# Patient Record
Sex: Male | Born: 1980 | Race: White | Hispanic: Yes | Marital: Single | Smoking: Never smoker
Health system: Southern US, Community
[De-identification: ages and names within clinical notes are randomized; demographics above are authoritative.]

## PROBLEM LIST (undated history)

## (undated) DIAGNOSIS — Z8719 Personal history of other diseases of the digestive system: Secondary | ICD-10-CM

---

## 2009-07-26 ENCOUNTER — Emergency Department (HOSPITAL_COMMUNITY): Admission: EM | Admit: 2009-07-26 | Discharge: 2009-07-26 | Payer: Self-pay | Admitting: Emergency Medicine

## 2011-03-12 LAB — URINALYSIS, ROUTINE W REFLEX MICROSCOPIC
Glucose, UA: NEGATIVE mg/dL
Ketones, ur: 15 mg/dL — AB
Specific Gravity, Urine: 1.031 — ABNORMAL HIGH (ref 1.005–1.030)
pH: 6 (ref 5.0–8.0)

## 2011-03-12 LAB — HEPATIC FUNCTION PANEL
ALT: 21 U/L (ref 0–53)
AST: 24 U/L (ref 0–37)
Albumin: 3.9 g/dL (ref 3.5–5.2)
Alkaline Phosphatase: 73 U/L (ref 39–117)
Bilirubin, Direct: 0.2 mg/dL (ref 0.0–0.3)
Indirect Bilirubin: 1.3 mg/dL — ABNORMAL HIGH (ref 0.3–0.9)
Total Bilirubin: 1.5 mg/dL — ABNORMAL HIGH (ref 0.3–1.2)
Total Protein: 7 g/dL (ref 6.0–8.3)

## 2011-03-12 LAB — BASIC METABOLIC PANEL
BUN: 12 mg/dL (ref 6–23)
CO2: 27 mEq/L (ref 19–32)
Calcium: 8.9 mg/dL (ref 8.4–10.5)
Chloride: 104 mEq/L (ref 96–112)
Creatinine, Ser: 1.09 mg/dL (ref 0.4–1.5)
GFR calc Af Amer: >60 mL/min (ref >60)
GFR calc non Af Amer: >60 mL/min (ref >60)
Glucose, Bld: 115 mg/dL — ABNORMAL HIGH (ref 70–99)
Potassium: 4 mEq/L (ref 3.5–5.1)
Sodium: 136 mEq/L (ref 135–145)

## 2011-03-12 LAB — DIFFERENTIAL
Basophils Absolute: 0 10*3/uL (ref 0.0–0.1)
Basophils Relative: 0 % (ref 0–1)
Eosinophils Absolute: 0.1 10*3/uL (ref 0.0–0.7)
Eosinophils Relative: 1 % (ref 0–5)
Lymphocytes Relative: 25 % (ref 12–46)
Lymphs Abs: 1.3 10*3/uL (ref 0.7–4.0)
Monocytes Absolute: 0.2 10*3/uL (ref 0.1–1.0)
Monocytes Relative: 4 % (ref 3–12)
Neutro Abs: 3.6 10*3/uL (ref 1.7–7.7)
Neutrophils Relative %: 69 % (ref 43–77)

## 2011-03-12 LAB — CBC
RBC: 5.05 MIL/uL (ref 4.22–5.81)
WBC: 5.2 10*3/uL (ref 4.0–10.5)

## 2011-03-12 LAB — LIPASE, BLOOD: Lipase: 19 U/L (ref 11–59)

## 2013-01-18 ENCOUNTER — Emergency Department (HOSPITAL_COMMUNITY)
Admission: EM | Admit: 2013-01-18 | Discharge: 2013-01-18 | Disposition: A | Discharge status: Home / Self Care | Payer: Self-pay | Attending: Emergency Medicine | Admitting: Emergency Medicine

## 2013-01-18 ENCOUNTER — Emergency Department (HOSPITAL_COMMUNITY): Payer: Self-pay

## 2013-01-18 ENCOUNTER — Encounter (HOSPITAL_COMMUNITY): Payer: Self-pay | Admitting: Emergency Medicine

## 2013-01-18 DIAGNOSIS — R739 Hyperglycemia, unspecified: Secondary | ICD-10-CM

## 2013-01-18 DIAGNOSIS — R0789 Other chest pain: Secondary | ICD-10-CM

## 2013-01-18 DIAGNOSIS — R631 Polydipsia: Secondary | ICD-10-CM | POA: Insufficient documentation

## 2013-01-18 DIAGNOSIS — R040 Epistaxis: Secondary | ICD-10-CM | POA: Insufficient documentation

## 2013-01-18 DIAGNOSIS — R071 Chest pain on breathing: Secondary | ICD-10-CM | POA: Insufficient documentation

## 2013-01-18 DIAGNOSIS — R7309 Other abnormal glucose: Secondary | ICD-10-CM | POA: Insufficient documentation

## 2013-01-18 DIAGNOSIS — R350 Frequency of micturition: Secondary | ICD-10-CM | POA: Insufficient documentation

## 2013-01-18 LAB — BASIC METABOLIC PANEL
BUN: 9 mg/dL (ref 6–23)
CO2: 27 mEq/L (ref 19–32)
Chloride: 105 mEq/L (ref 96–112)
Creatinine, Ser: 0.9 mg/dL (ref 0.50–1.35)
Glucose, Bld: 170 mg/dL — ABNORMAL HIGH (ref 70–99)
Potassium: 4.1 mEq/L (ref 3.5–5.1)

## 2013-01-18 LAB — CBC WITH DIFFERENTIAL/PLATELET
Basophils Relative: 0 % (ref 0–1)
HCT: 43.8 % (ref 39.0–52.0)
Hemoglobin: 15.8 g/dL (ref 13.0–17.0)
Lymphocytes Relative: 30 % (ref 12–46)
Lymphs Abs: 1.7 10*3/uL (ref 0.7–4.0)
MCHC: 36.1 g/dL — ABNORMAL HIGH (ref 30.0–36.0)
Monocytes Absolute: 0.4 10*3/uL (ref 0.1–1.0)
Monocytes Relative: 8 % (ref 3–12)
Neutro Abs: 3.3 10*3/uL (ref 1.7–7.7)
RBC: 5.25 MIL/uL (ref 4.22–5.81)

## 2013-01-18 MED ORDER — IBUPROFEN 600 MG PO TABS: 600.0000 mg | ORAL_TABLET | Freq: Four times a day (QID) | ORAL | Status: DC | PRN

## 2013-01-18 NOTE — ED Notes (Signed)
Used language line to communicate with patient.  

## 2013-01-18 NOTE — ED Notes (Signed)
Onset 2-3 weeks ago pain left forearm and bilateral lower extremity pain.  Today woke up with heart palpitations pain 9/10 achy sharp currently 2/10 achy.  Also states intermittent bloody nose scant amount.

## 2013-01-18 NOTE — ED Provider Notes (Signed)
History     CSN: [id number]  Arrival date & time 01/18/13  1257   First MD Initiated Contact with Patient 01/18/13 1658      Chief Complaint  Patient presents with  . Chest Pain  . Epistaxis    Patient is a 32 y.o. male presenting with chest pain and nosebleeds.  Chest Pain Epistaxis   Onset 2-3 weeks ago pain left forearm and bilateral lower extremity pain. Today woke up with heart palpitations pain 9/10 achy sharp currently 2/10 achy. Also states intermittent bloody nose scant amount.  Patient also admits increased thirst and urination.  Patient apparently had upper rest for symptoms 1-2 days ago.  Denies any family history of coronary disease.  Denies drug abuse.  Pain can occur at rest or with activity and is unpredictable.   History reviewed. No pertinent past medical history.  History reviewed. No pertinent past surgical history.  No family history on file.  History  Substance Use Topics  . Smoking status: Never Smoker   . Smokeless tobacco: Not on file  . Alcohol Use: Yes      Review of Systems  HENT: Positive for nosebleeds.   Cardiovascular: Positive for chest pain.    Allergies  Review of patient's allergies indicates no known allergies.  Home Medications   Current Outpatient Rx  Name  Route  Sig  Dispense  Refill  . ibuprofen (ADVIL,MOTRIN) 600 MG tablet   Oral   Take 1 tablet (600 mg total) by mouth every 6 (six) hours as needed for pain.   30 tablet   0     BP 129/75  Pulse 62  Temp(Src) 98.6 F (37 C) (Oral)  Resp 21  SpO2 100%  Physical Exam  Nursing note and vitals reviewed. Constitutional: He is oriented to person, place, and time. He appears well-developed and well-nourished. No distress.  HENT:  Head: Normocephalic and atraumatic.  Eyes: Pupils are equal, round, and reactive to light.  Neck: Normal range of motion.  Cardiovascular: Normal rate and intact distal pulses.   Pulmonary/Chest: No respiratory distress. He has no  wheezes. He has no rales.  Abdominal: Normal appearance. He exhibits no distension. There is no tenderness.  Musculoskeletal: Normal range of motion.  Neurological: He is alert and oriented to person, place, and time. No cranial nerve deficit.  Skin: Skin is warm and dry. No rash noted.  Psychiatric: He has a normal mood and affect. His behavior is normal.    ED Course  Procedures (including critical care time)  Date: 01/18/2013  Rate: 90  Rhythm: normal sinus rhythm with sinus arrhythmia  QRS Axis: normal  Intervals: normal  ST/T Wave abnormalities: normal  Conduction Disutrbances: none Interpretation: Normal        Labs Reviewed  CBC WITH DIFFERENTIAL - Abnormal; Notable for the following:    MCHC 36.1 (*)    All other components within normal limits  BASIC METABOLIC PANEL - Abnormal; Notable for the following:    Glucose, Bld 170 (*)    All other components within normal limits  POCT I-STAT TROPONIN I   Dg Chest 2 View  01/18/2013  *RADIOLOGY REPORT*  Clinical Data: Chest pain, epistaxis, rapid heart rate  CHEST - 2 VIEW  Comparison: None.  Findings: No active infiltrate or effusion is seen.  Slightly prominent perihilar markings are noted.  Mediastinal contours appear normal.  The heart is within normal limits in size.  No skeletal abnormality is seen to  IMPRESSION: No active  lung disease.  Slightly prominent peribronchial thickening.   Original Report Authenticated By: Dwyane Dee, M.D.      1. Hyperglycemia   2. Atypical chest pain       MDM         Nelia Shi, MD 01/18/13 [phone]

## 2014-08-29 ENCOUNTER — Ambulatory Visit: Payer: Self-pay

## 2017-07-25 ENCOUNTER — Encounter (HOSPITAL_COMMUNITY): Payer: Self-pay | Admitting: Emergency Medicine

## 2017-07-25 ENCOUNTER — Ambulatory Visit (HOSPITAL_COMMUNITY)
Admission: EM | Admit: 2017-07-25 | Discharge: 2017-07-25 | Disposition: A | Discharge status: Home / Self Care | Payer: Self-pay | Attending: Family Medicine | Admitting: Family Medicine

## 2017-07-25 DIAGNOSIS — R1013 Epigastric pain: Secondary | ICD-10-CM

## 2017-07-25 DIAGNOSIS — K219 Gastro-esophageal reflux disease without esophagitis: Secondary | ICD-10-CM

## 2017-07-25 MED ORDER — PANTOPRAZOLE SODIUM 20 MG PO TBEC: 20.0000 mg | DELAYED_RELEASE_TABLET | Freq: Every day | ORAL | 0 refills | Status: DC

## 2017-07-25 NOTE — Discharge Instructions (Signed)
Start protonix with your zantac. Follow directions on the resource page for foods to avoid. Avoid laying down after eating. Continue to treat for constipation, as it could be contributing to your symptoms. Follow up with PCP or GI for further workup and treatment needed for symptoms.

## 2017-07-25 NOTE — ED Provider Notes (Signed)
[facility]    CSN: [id number] Arrival date & time: 07/25/17  1140     History   Chief Complaint Chief Complaint  Patient presents with  . Abdominal Pain    HPI Marco Blackwell is a 36 y.o. male.   36 year old male comes in for 10 year history of epigastric pain, nausea, vomiting, decrease food intake. He has been to the emergency department twice 4-5 years ago for same symptoms, but had described it as chest pain due to language barrier and had cardiac workup instead. He is in with family member who is the translator today. Epigastric pain can radiate to the left upper quadrant as well as the back. He has been taking Zantac with little relief. He states pain is now constant, with a burning sensation. It is worse with food intake with spicy food worse then bland food. He's had mild vomiting due to acid reflux, denies blood in vomit. He struggles with constipation, and has been taking vitamins as well as smoothies to help with constipation, had normal bowel movement today and yesterday. Denies blood in stool. Denies fever, chills, night sweats. He endorses chest pain and shortness of breath during the burning sensation, which has also prevented him from exercising. No personal history of hypertension, heart disease, diabetes. No family history of heart disease or diabetes, specifically denies family history of MIs. Denies history of abdominal surgeries.      History reviewed. No pertinent past medical history.  There are no active problems to display for this patient.   History reviewed. No pertinent surgical history.     Home Medications    Prior to Admission medications   Medication Sig Start Date End Date Taking? Authorizing Provider  aluminum-magnesium hydroxide 200-200 MG/5ML suspension Take by mouth every 6 (six) hours as needed for indigestion.   Yes [provider]  ranitidine (ZANTAC) 150 MG tablet Take 150 mg by mouth 2 (two) times daily.   Yes  [provider]  pantoprazole (PROTONIX) 20 MG tablet Take 1 tablet (20 mg total) by mouth daily. 07/25/17   Belinda Fisher, PA-C    Family History No family history on file.  Social History Social History  Substance Use Topics  . Smoking status: Never Smoker  . Smokeless tobacco: Not on file  . Alcohol use Yes     Allergies   Patient has no known allergies.   Review of Systems Review of Systems  Reason unable to perform ROS: See HPI as above.     Physical Exam Triage Vital Signs ED Triage Vitals  Enc Vitals Group     BP 07/25/17 1231 122/77     Pulse Rate 07/25/17 1231 (!) 51     Resp 07/25/17 1231 18     Temp 07/25/17 1231 98.2 F (36.8 C)     Temp Source 07/25/17 1231 Oral     SpO2 07/25/17 1231 99 %     Weight --      Height --      Head Circumference --      Peak Flow --      Pain Score 07/25/17 1227 10     Pain Loc --      Pain Edu? --      Excl. in GC? --    No data found.   Updated Vital Signs BP 122/77 (BP Location: Left Arm)   Pulse (!) 51   Temp 98.2 F (36.8 C) (Oral)   Resp 18  SpO2 99%    Physical Exam  Constitutional: He is oriented to person, place, and time. He appears well-developed and well-nourished. No distress.  HENT:  Head: Normocephalic and atraumatic.  Eyes: Pupils are equal, round, and reactive to light. Conjunctivae are normal.  Cardiovascular: Normal rate, regular rhythm and normal heart sounds.  Exam reveals no gallop and no friction rub.   No murmur heard. Pulmonary/Chest: Effort normal and breath sounds normal. No respiratory distress. He has no wheezes. He has no rales.  Abdominal: Soft. Bowel sounds are normal. He exhibits no mass. There is tenderness (epigastric). There is no rebound and no guarding. No hernia.  Neurological: He is alert and oriented to person, place, and time.  Skin: Skin is warm and dry.     UC Treatments / Results  Labs (all labs ordered are listed, but only abnormal results are  displayed) Labs Reviewed - No data to display  EKG  EKG Interpretation None       Radiology No results found.  Procedures ED EKG Date/Time: 07/25/2017 5:00 PM Performed by: Linward Headland V Authorized by: Linward Headland V   ECG reviewed by ED Physician in the absence of a cardiologist: yes   Previous ECG:    Previous ECG:  Compared to current   Comparison ECG info:  Bradycardia, otherwise no changes.  Interpretation:    Interpretation: normal   Rate:    ECG rate:  55   ECG rate assessment: bradycardic   Rhythm:    Rhythm: sinus rhythm      (including critical care time)  Medications Ordered in UC Medications - No data to display   Initial Impression / Assessment and Plan / UC Course  I have reviewed the triage vital signs and the nursing notes.  Pertinent labs & imaging results that were available during my care of the patient were reviewed by me and considered in my medical decision making (see chart for details).    Discussed with patient and family member no alarming signs today. EKG performed today with bradycardia at 55 bpm, normal sinus rhythm. Patient without history of diabetes, hypertension, heart disease. No family history of heart disease or diabetes. Low suspicion for cardiac causes of epigastric to left upper quadrant pain, chest pain and shortness of breath. Will start treatment for GERD, patient to continue zantac and start protonix. Instructions on food to avoid given to patient. Patient also to continue treatment of constipation, which could be contributing to his current symptoms. Patient to follow up with PCP and GI specialist for further evaluation of abdominal pain. Return precautions given.  Final Clinical Impressions(s) / UC Diagnoses   Final diagnoses:  Epigastric pain  Gastroesophageal reflux disease without esophagitis    New Prescriptions Discharge Medication List as of 07/25/2017  1:43 PM    START taking these medications   Details  pantoprazole  (PROTONIX) 20 MG tablet Take 1 tablet (20 mg total) by mouth daily., Starting Tue 07/25/2017, Normal          Belinda Fisher, PA-C 07/25/17 1716

## 2017-07-25 NOTE — ED Triage Notes (Signed)
Burning pain in center, epigastric area.  Sometimes pain moves slightly to left, and sometimes in the back.  Pain has been occurring for 10 years.  ?hiatal hernia, with eating feels reflux to a point of vomiting

## 2019-02-16 DIAGNOSIS — K649 Unspecified hemorrhoids: Secondary | ICD-10-CM | POA: Insufficient documentation

## 2019-02-16 DIAGNOSIS — R3589 Other polyuria: Secondary | ICD-10-CM | POA: Insufficient documentation

## 2019-02-16 DIAGNOSIS — K219 Gastro-esophageal reflux disease without esophagitis: Secondary | ICD-10-CM | POA: Insufficient documentation

## 2019-06-03 DIAGNOSIS — R739 Hyperglycemia, unspecified: Secondary | ICD-10-CM | POA: Insufficient documentation

## 2019-06-03 DIAGNOSIS — R030 Elevated blood-pressure reading, without diagnosis of hypertension: Secondary | ICD-10-CM | POA: Insufficient documentation

## 2019-06-03 DIAGNOSIS — M940 Chondrocostal junction syndrome [Tietze]: Secondary | ICD-10-CM | POA: Insufficient documentation

## 2019-06-03 DIAGNOSIS — R0789 Other chest pain: Secondary | ICD-10-CM | POA: Insufficient documentation

## 2020-02-04 ENCOUNTER — Ambulatory Visit: Payer: Self-pay | Admitting: Emergency Medicine

## 2020-02-05 ENCOUNTER — Encounter: Payer: Self-pay | Admitting: Emergency Medicine

## 2021-05-14 ENCOUNTER — Encounter: Payer: Self-pay | Admitting: Emergency Medicine

## 2021-05-14 ENCOUNTER — Emergency Department (INDEPENDENT_AMBULATORY_CARE_PROVIDER_SITE_OTHER)
Admission: EM | Admit: 2021-05-14 | Discharge: 2021-05-14 | Disposition: A | Discharge status: Home / Self Care | Payer: 59 | Source: Home / Self Care | Attending: Family Medicine | Admitting: Family Medicine

## 2021-05-14 ENCOUNTER — Other Ambulatory Visit: Payer: Self-pay

## 2021-05-14 DIAGNOSIS — K219 Gastro-esophageal reflux disease without esophagitis: Secondary | ICD-10-CM

## 2021-05-14 DIAGNOSIS — R1030 Lower abdominal pain, unspecified: Secondary | ICD-10-CM

## 2021-05-14 HISTORY — DX: Personal history of other diseases of the digestive system: Z87.19

## 2021-05-14 LAB — POCT URINALYSIS DIP (MANUAL ENTRY)
Bilirubin, UA: NEGATIVE
Blood, UA: NEGATIVE
Glucose, UA: NEGATIVE mg/dL
Ketones, POC UA: NEGATIVE mg/dL
Leukocytes, UA: NEGATIVE
Nitrite, UA: NEGATIVE
Protein Ur, POC: NEGATIVE mg/dL
Spec Grav, UA: >=1.03 — AB (ref 1.010–1.025)
Urobilinogen, UA: 0.2 E.U./dL
pH, UA: 6 (ref 5.0–8.0)

## 2021-05-14 MED ORDER — OMEPRAZOLE 40 MG PO CPDR: DELAYED_RELEASE_CAPSULE | ORAL | 1 refills | Status: DC

## 2021-05-14 NOTE — ED Triage Notes (Signed)
Abdominal pain - RLQ x 1 week with umbilical tenderness w/ urinary frequency & diarrhea (3-4 days) No OTC meds  Stool is black the last 3 days  Denies N/V Abd pain is 5/10 Constant sharp pain

## 2021-05-14 NOTE — ED Provider Notes (Signed)
[facility]    CSN: [id number] Arrival date & time: 05/14/21  1703      History   Chief Complaint Chief Complaint  Patient presents with   Abdominal Pain    RLQ    HPI Marco Blackwell is a 40 y.o. male.   Patient is Spanish speaking; interview facilitated by an interpretor. He has a past history of GERD, having taking a PPI intermittently.  During the past several weeks his epigastric discomfort has increased.  He often feels that eating might improve his abdominal discomfort, but he actually feels worse after eating.  During the past 3 days he has noted loose and occasional black stools.  He denies nausea/vomiting.  He has also developed urinary frequency without dysuria. During the past two weeks he has also developed lower abdominal pain, more prominent in the lower right abdomen.  The pain radiates somewhat to his right flank, and is worse with any movement such as bending over.  He denies fevers, chills, and sweats.  The history is provided by the patient. The history is limited by a language barrier. A language interpreter was used.  Abdominal Pain Pain location:  Periumbilical and RLQ Pain quality: aching and dull   Pain radiates to:  R flank Pain severity:  Moderate Duration:  2 weeks Timing:  Constant Progression:  Worsening Chronicity:  New Context: awakening from sleep and eating   Context: not diet changes, not laxative use, not medication withdrawal, not previous surgeries, not recent illness, not retching and not suspicious food intake   Relieved by:  Nothing Worsened by:  Movement and palpation Ineffective treatments:  None tried Associated symptoms: anorexia, fatigue, melena and nausea   Associated symptoms: no chest pain, no chills, no constipation, no cough, no diarrhea, no dysuria, no fever, no hematemesis, no hematochezia, no hematuria, no shortness of breath, no sore throat and no vomiting   Risk factors: no NSAID use    Past  Medical History:  Diagnosis Date   H/O gastroesophageal reflux (GERD)     Patient Active Problem List   Diagnosis Date Noted   Atypical chest pain 06/03/2019   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 No pertinent surgical history.     Home Medications    Prior to Admission medications   Medication Sig Start Date End Date Taking? Authorizing Provider  omeprazole (PRILOSEC) 40 MG capsule Take one cap PO once daily, 20 minutes AC 05/14/21  Yes Lattie Haw, MD  aluminum-magnesium hydroxide 200-200 MG/5ML suspension Take by mouth every 6 (six) hours as needed for indigestion. Patient not taking: Reported on 05/14/2021    [provider]  ranitidine (ZANTAC) 150 MG tablet Take 150 mg by mouth 2 (two) times daily. Patient not taking: Reported on 05/14/2021    [provider]    Family History Family History  Problem Relation Age of Onset   Hypertension Mother    Thyroid disease Mother    Healthy Father     Social History Social History   Tobacco Use   Smoking status: Never   Smokeless tobacco: Never  Substance Use Topics   Alcohol use: Yes    Alcohol/week: 2.0 standard drinks    Types: 2 Standard drinks or equivalent per week   Drug use: No  Allergies   Patient has no known allergies.   Review of Systems Review of Systems  Constitutional:  Positive for activity change and fatigue. Negative for appetite change, chills, diaphoresis and fever.  HENT: Negative.  Negative for sore throat.   Eyes: Negative.   Respiratory:  Negative for cough and shortness of breath.   Cardiovascular:  Negative for chest pain.  Gastrointestinal:  Positive for abdominal pain, anorexia, melena and nausea.  Negative for abdominal distention, blood in stool, constipation, diarrhea, hematemesis, hematochezia, rectal pain and vomiting.  Genitourinary:  Negative for difficulty urinating, dysuria, flank pain, frequency, hematuria, penile pain, penile swelling, scrotal swelling, testicular pain and urgency.  Musculoskeletal:  Negative for neck pain.  Skin: Negative.   Hematological:  Negative for adenopathy.    Physical Exam Triage Vital Signs ED Triage Vitals  Enc Vitals Group     BP 05/14/21 1738 132/76     Pulse Rate 05/14/21 1738 63     Resp 05/14/21 1738 17     Temp 05/14/21 1738 98.9 F (37.2 C)     Temp Source 05/14/21 1738 Oral     SpO2 05/14/21 1738 98 %     Weight 05/14/21 1745 185 lb (83.9 kg)     Height 05/14/21 1745 5\' 6"  (1.676 m)     Head Circumference --      Peak Flow --      Pain Score 05/14/21 1744 5     Pain Loc --      Pain Edu? --      Excl. in GC? --    No data found.  Updated Vital Signs BP 132/76 (BP Location: Right Arm)   Pulse 63   Temp 98.9 F (37.2 C) (Oral)   Resp 17   Ht 5\' 6"  (1.676 m)   Wt 83.9 kg   SpO2 98%   BMI 29.86 kg/m   Visual Acuity Right Eye Distance:   Left Eye Distance:   Bilateral Distance:    Right Eye Near:   Left Eye Near:    Bilateral Near:     Physical Exam Vitals and nursing note reviewed.  Constitutional:      General: He is not in acute distress.    Appearance: He is not ill-appearing.  HENT:     Head: Normocephalic.     Mouth/Throat:     Mouth: Mucous membranes are moist.  Eyes:     Extraocular Movements: Extraocular movements intact.  Cardiovascular:     Rate and Rhythm: Normal rate and regular rhythm.     Heart sounds: Normal heart sounds.  Pulmonary:     Breath sounds: Normal breath sounds.  Abdominal:     General: Abdomen is flat. Bowel sounds are normal. There is no distension.     Palpations: Abdomen is soft. There is no hepatomegaly, splenomegaly or mass.     Tenderness: There is abdominal  tenderness in the right lower quadrant, epigastric area and periumbilical area. There is guarding. There is no right CVA tenderness, left CVA tenderness or rebound. Negative signs include McBurney's sign, psoas sign and obturator sign.     Hernia: No hernia is present.    Skin:    General: Skin is warm and dry.     Findings: No rash.  Neurological:     Mental Status: He is alert and oriented to person, place, and time.     UC Treatments / Results  Labs (all labs ordered are listed, but only abnormal results are displayed)  Labs Reviewed  POCT URINALYSIS DIP (MANUAL ENTRY) - Abnormal; Notable for the following components:      Result Value   Spec Grav, UA >=1.030 (*)    All other components within normal limits  COMPLETE METABOLIC PANEL WITH GFR  CBC WITH DIFFERENTIAL/PLATELET    EKG   Radiology No results found.  Procedures Procedures (including critical care time)  Medications Ordered in UC Medications - No data to display  Initial Impression / Assessment and Plan / UC Course  I have reviewed the triage vital signs and the nursing notes.  Pertinent labs & imaging results that were available during my care of the patient were reviewed by me and considered in my medical decision making (see chart for details).    Patient appears to have recurrence of GERD, and possible duodenal ulcer. Resume PPI. Urinalysis unremarkable.  CBC and CMP pending. Suspect new onset ?appendicitis or ?diverticulitis. Return tomorrow morning for CT abdomen/pelvis with oral contrast.  Final Clinical Impressions(s) / UC Diagnoses   Final diagnoses:  Gastroesophageal reflux disease, unspecified whether esophagitis present  Lower abdominal pain     Discharge Instructions      If symptoms become significantly worse during the night or over the weekend, proceed to the local emergency room.      ED Prescriptions     Medication Sig Dispense Auth. Provider   omeprazole (PRILOSEC) 40 MG  capsule Take one cap PO once daily, 20 minutes AC 30 capsule Lattie Haw, MD         Lattie Haw, MD 05/15/21 [phone]

## 2021-05-14 NOTE — Discharge Instructions (Signed)
If symptoms become significantly worse during the night or over the weekend, proceed to the local emergency room.  

## 2021-05-15 ENCOUNTER — Emergency Department (INDEPENDENT_AMBULATORY_CARE_PROVIDER_SITE_OTHER): Payer: 59

## 2021-05-15 ENCOUNTER — Emergency Department
Admission: EM | Admit: 2021-05-15 | Discharge: 2021-05-15 | Disposition: A | Discharge status: Home / Self Care | Payer: 59 | Source: Home / Self Care

## 2021-05-15 DIAGNOSIS — K921 Melena: Secondary | ICD-10-CM

## 2021-05-15 DIAGNOSIS — R1031 Right lower quadrant pain: Secondary | ICD-10-CM

## 2021-05-15 LAB — COMPLETE METABOLIC PANEL WITH GFR
AG Ratio: 1.5 (calc) (ref 1.0–2.5)
ALT: 66 U/L — ABNORMAL HIGH (ref 9–46)
AST: 37 U/L (ref 10–40)
Albumin: 4.9 g/dL (ref 3.6–5.1)
Alkaline phosphatase (APISO): 79 U/L (ref 36–130)
BUN: 15 mg/dL (ref 7–25)
CO2: 24 mmol/L (ref 20–32)
Calcium: 9.9 mg/dL (ref 8.6–10.3)
Chloride: 105 mmol/L (ref 98–110)
Creat: 1.06 mg/dL (ref 0.60–1.35)
GFR, Est African American: 102 mL/min/{1.73_m2} (ref >=60)
GFR, Est Non African American: 88 mL/min/{1.73_m2} (ref >=60)
Globulin: 3.3 g/dL (calc) (ref 1.9–3.7)
Glucose, Bld: 95 mg/dL (ref 65–99)
Potassium: 3.9 mmol/L (ref 3.5–5.3)
Sodium: 138 mmol/L (ref 135–146)
Total Bilirubin: 0.9 mg/dL (ref 0.2–1.2)
Total Protein: 8.2 g/dL — ABNORMAL HIGH (ref 6.1–8.1)

## 2021-05-15 LAB — CBC WITH DIFFERENTIAL/PLATELET
Absolute Monocytes: 372 cells/uL (ref 200–950)
Basophils Absolute: 43 cells/uL (ref 0–200)
Basophils Relative: 0.7 %
Eosinophils Absolute: 79 cells/uL (ref 15–500)
Eosinophils Relative: 1.3 %
HCT: 47.8 % (ref 38.5–50.0)
Hemoglobin: 16.4 g/dL (ref 13.2–17.1)
Lymphs Abs: 1970 cells/uL (ref 850–3900)
MCH: 30.2 pg (ref 27.0–33.0)
MCHC: 34.3 g/dL (ref 32.0–36.0)
MCV: 88 fL (ref 80.0–100.0)
MPV: 9.4 fL (ref 7.5–12.5)
Monocytes Relative: 6.1 %
Neutro Abs: 3636 cells/uL (ref 1500–7800)
Neutrophils Relative %: 59.6 %
Platelets: 304 10*3/uL (ref 140–400)
RBC: 5.43 10*6/uL (ref 4.20–5.80)
RDW: 13 % (ref 11.0–15.0)
Total Lymphocyte: 32.3 %
WBC: 6.1 10*3/uL (ref 3.8–10.8)

## 2021-05-15 NOTE — ED Triage Notes (Signed)
Pt returns today for abd CT w/ PO contrast - RBTO verified from Dr Cathren Harsh. Pt to CT w/ interpretor [employer] ipad. No VS or extended triage done

## 2021-05-16 ENCOUNTER — Telehealth: Payer: Self-pay | Admitting: Emergency Medicine

## 2021-05-16 NOTE — Telephone Encounter (Signed)
Pt returns today to review results in person - copy of results given to pt per Dr Cathren Harsh- results explained in person w/ [facility] interpretor -[place] [phone]. Pt verbalized an understanding that he should continue reflux medicine. If pain is not tolerable w/ OTC tylenol, heating pad or ice, then patient should follow up in ED. Pt verbalized an understanding, no other questions at this time

## 2021-10-06 ENCOUNTER — Emergency Department (INDEPENDENT_AMBULATORY_CARE_PROVIDER_SITE_OTHER)
Admission: EM | Admit: 2021-10-06 | Discharge: 2021-10-06 | Disposition: A | Discharge status: Home / Self Care | Payer: 59 | Source: Home / Self Care | Attending: Family Medicine | Admitting: Family Medicine

## 2021-10-06 ENCOUNTER — Other Ambulatory Visit: Payer: Self-pay

## 2021-10-06 DIAGNOSIS — M542 Cervicalgia: Secondary | ICD-10-CM | POA: Diagnosis not present

## 2021-10-06 MED ORDER — MELOXICAM 15 MG PO TABS: 15.0000 mg | ORAL_TABLET | Freq: Every day | ORAL | 0 refills | Status: DC

## 2021-10-06 MED ORDER — CYCLOBENZAPRINE HCL 10 MG PO TABS: 10.0000 mg | ORAL_TABLET | Freq: Every day | ORAL | 0 refills | Status: DC

## 2021-10-06 NOTE — Discharge Instructions (Addendum)
Take the meloxicam once a day.  This is an anti-inflammatory medicine.  Take it with a meal Take the cyclobenzaprine at bedtime.  This is a muscle relaxer.  It will help relax the muscles in her neck. The thyroid test will be available in a day or 2.  It will be available on MyChart You need to follow-up with a primary care doctor for your regular medical needs and preventative medical care

## 2021-10-06 NOTE — ED Triage Notes (Signed)
Pt c/o neck pain x 2 weeks. Denies injury. Pain 8/10 No OTC meds tried.

## 2021-10-06 NOTE — ED Provider Notes (Signed)
[facility]    CSN: [id number] Arrival date & time: 10/06/21  1628      History   Chief Complaint Chief Complaint  Patient presents with   Neck Pain    HPI Marco Blackwell is a 40 y.o. male.   HPI  40 year old Hispanic gentleman who has no primary care doctor.  Is here with complaint of neck pain for 2 weeks.  He states the neck pain is actually bother him off and on for a long time.  I am unable to determine exactly how long.  The patient states that he has pain in his anterior neck and points to his sternal notch point.  He points to the right side of his thyroid.  He states sometimes it feels like he cannot breathe or cannot swallow.  He then says the pain goes around to the back of his neck on the right side and then travels up behind his ear.  It is sometimes worse with movement and activity.  It hurts to turn his head towards the right side.  No numbness or weakness into the arms.  No trauma.  No overuse.  He states it came on gradually 1 day, uncertain what he was doing on that day.  He states thyroid disease runs in his family and mother.  No palpitations, no weight loss, no diarrhea.  He does still sometimes have GERD symptoms and points to his epigastrium.  He is seen with a Spanish interpreter.  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 No pertinent surgical history.     Home Medications    Prior to Admission medications   Medication Sig Start Date End Date Taking? Authorizing Provider  cyclobenzaprine (FLEXERIL) 10 MG  tablet Take 1 tablet (10 mg total) by mouth at bedtime. 10/06/21  Yes Eustace Moore, MD  meloxicam (MOBIC) 15 MG tablet Take 1 tablet (15 mg total) by mouth daily. 10/06/21  Yes Eustace Moore, MD  omeprazole (PRILOSEC) 40 MG capsule Take one cap PO once daily, 20 minutes [facility] 05/14/21   Lattie Haw, MD    Family History Family History  Problem Relation Age of Onset   Hypertension Mother    Thyroid disease Mother    Healthy Father     Social History Social History   Tobacco Use   Smoking status: Never   Smokeless tobacco: Never  Substance Use Topics   Alcohol use: Yes    Alcohol/week: 2.0 standard drinks    Types: 2 Standard drinks or equivalent per week   Drug use: No     Allergies   Patient has no known allergies.   Review of Systems Review of Systems  See HPI Physical Exam Triage Vital Signs ED Triage Vitals  Enc Vitals Group     BP 10/06/21 1707 (!) 153/69     Pulse Rate 10/06/21 1707 76     Resp 10/06/21 1707 17     Temp 10/06/21 1707 98.5 F (36.9 C)     Temp Source 10/06/21 1707 Oral     SpO2 10/06/21 1707 98 %  Weight --      Height --      Head Circumference --      Peak Flow --      Pain Score 10/06/21 1709 8     Pain Loc --      Pain Edu? --      Excl. in GC? --    No data found.  Updated Vital Signs BP (!) 153/69 (BP Location: Right Arm)   Pulse 76   Temp 98.5 F (36.9 C) (Oral)   Resp 17   SpO2 98%      Physical Exam Constitutional:      General: He is not in acute distress.    Appearance: He is well-developed and normal weight.  HENT:     Head: Normocephalic and atraumatic.     Right Ear: Tympanic membrane and ear canal normal.     Left Ear: Tympanic membrane and ear canal normal.     Nose: Nose normal.     Mouth/Throat:     Pharynx: No posterior oropharyngeal erythema.  Eyes:     Conjunctiva/sclera: Conjunctivae normal.     Pupils: Pupils are equal, round, and reactive to light.  Neck:     Vascular: No carotid  bruit.      Comments: Patient states is tender in the area described.  Sternal notch at the left side of the thyroid and up the left side of the anterior neck.  Pressure in this area causes pain to shoot up behind his right ear to the scalp.  This entire area has normal appearance, symmetric.  No swelling.  No erythema.  No warmth.  Patient has pain with range of motion turning face towards right Cardiovascular:     Rate and Rhythm: Normal rate.  Pulmonary:     Effort: Pulmonary effort is normal. No respiratory distress.  Abdominal:     General: There is no distension.     Palpations: Abdomen is soft.  Musculoskeletal:        General: Normal range of motion.     Cervical back: Tenderness present. No rigidity.  Lymphadenopathy:     Cervical: No cervical adenopathy.  Skin:    General: Skin is warm and dry.  Neurological:     Mental Status: He is alert.     UC Treatments / Results  Labs (all labs ordered are listed, but only abnormal results are displayed) Labs Reviewed - No data to display  EKG   Radiology No results found.  Procedures Procedures (including critical care time)  Medications Ordered in UC Medications - No data to display  Initial Impression / Assessment and Plan / UC Course  I have reviewed the triage vital signs and the nursing notes.  Pertinent labs & imaging results that were available during my care of the patient were reviewed by me and considered in my medical decision making (see chart for details).     Pain in neck unclear etiology.  We will check a TSH.  He thinks it is from stress and that he has muscle tension.  We will treat him with anti-inflammatories and muscle relaxers.  PCP is highly recommended Final Clinical Impressions(s) / UC Diagnoses   Final diagnoses:  Neck pain     Discharge Instructions      Take the meloxicam once a day.  This is an anti-inflammatory medicine.  Take it with a meal Take the cyclobenzaprine at bedtime.   This is a muscle relaxer.  It will help relax  the muscles in her neck. The thyroid test will be available in a day or 2.  It will be available on MyChart You need to follow-up with a primary care doctor for your regular medical needs and preventative medical care    ED Prescriptions     Medication Sig Dispense Auth. Provider   meloxicam (MOBIC) 15 MG tablet Take 1 tablet (15 mg total) by mouth daily. 15 tablet Eustace Moore, MD   cyclobenzaprine (FLEXERIL) 10 MG tablet Take 1 tablet (10 mg total) by mouth at bedtime. 20 tablet Eustace Moore, MD      PDMP not reviewed this encounter.   Eustace Moore, MD 10/06/21 [phone]

## 2021-10-07 LAB — TSH: TSH: 1.81 mIU/L (ref 0.40–4.50)

## 2021-10-08 NOTE — Progress Notes (Signed)
Patient came by office.  I discussed results with him. He verbalized understainding

## 2022-09-02 IMAGING — CT CT ABD-PELV W/O CM
2 of 4 series · 16 of 46 positions shown, 18 images · non-contrast
Comparison: None.

CLINICAL DATA: Right lower quadrant pain with blood in stools.

EXAM:
CT ABDOMEN AND PELVIS WITHOUT CONTRAST
TECHNIQUE: Multidetector CT imaging of the abdomen and pelvis was performed
following the standard protocol without IV contrast.

[Series 2: axial st · axial · 0.83mm/px · z∈[-532,-56]mm · 13 of 105 slices shown, 15 images]
[im 5/105  soft-tissue]
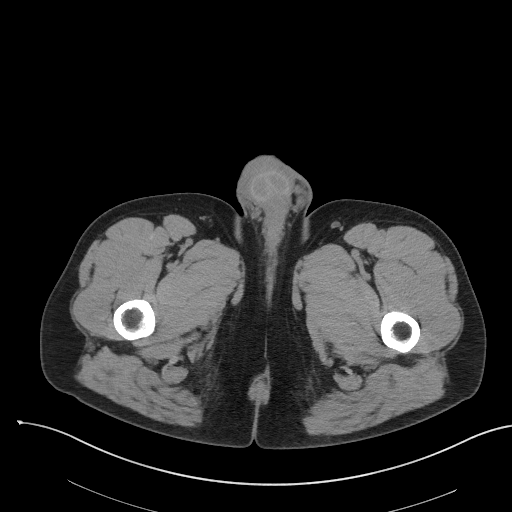
[im 5/105  bone]
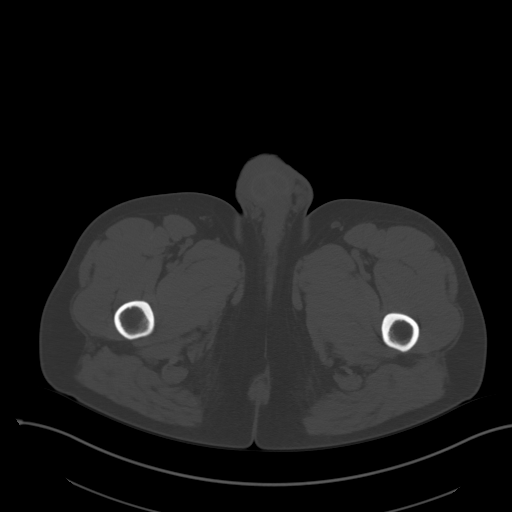
[im 13/105  soft-tissue]
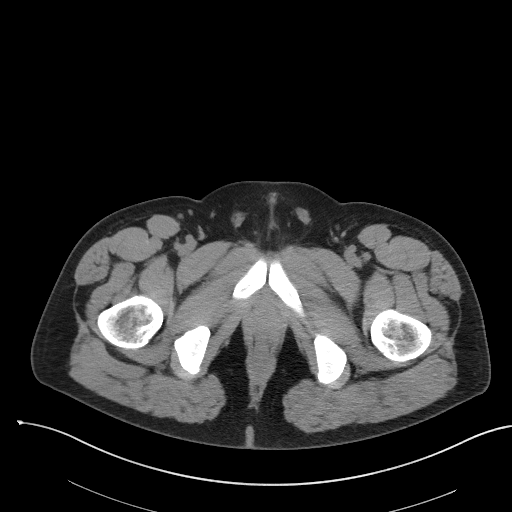
[im 21/105  soft-tissue]
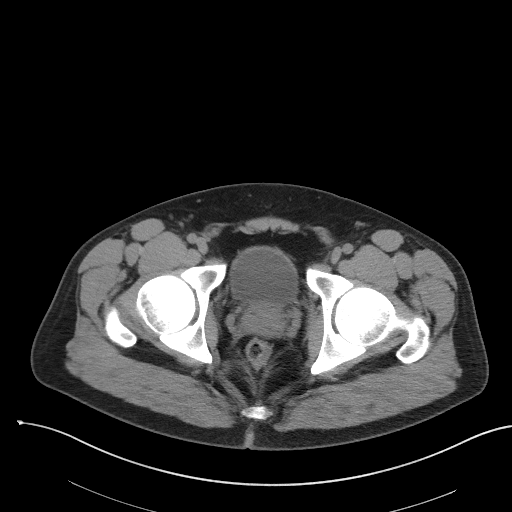
[im 30/105  soft-tissue]
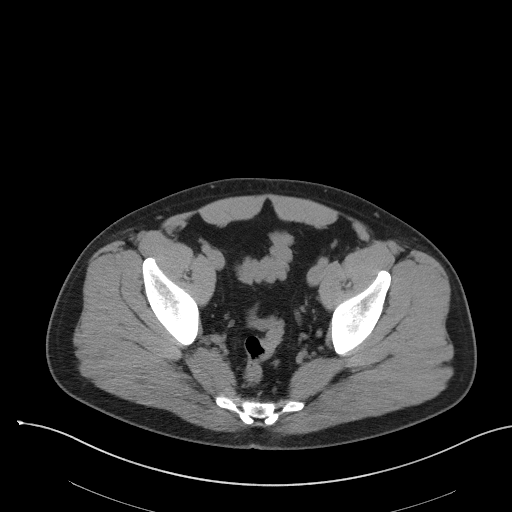
[im 38/105  soft-tissue]
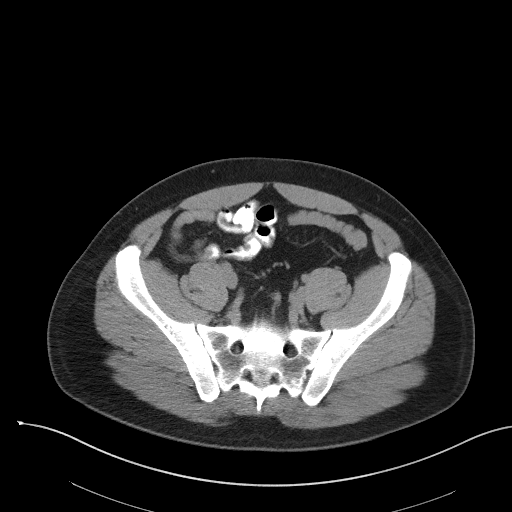
[im 46/105  soft-tissue]
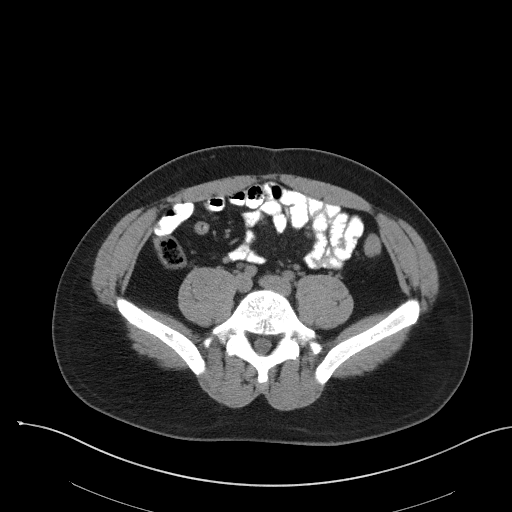
[im 55/105  soft-tissue]
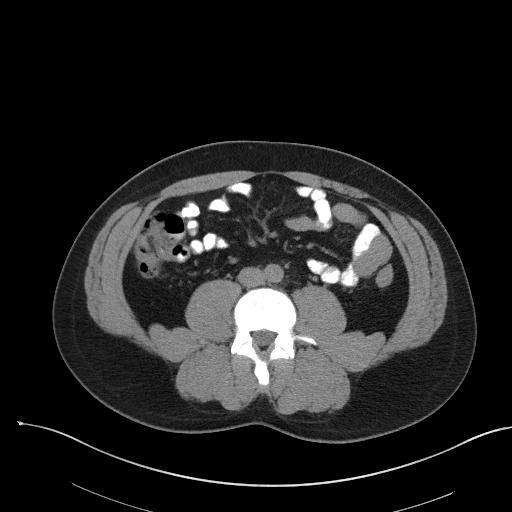
[im 59/105  soft-tissue]
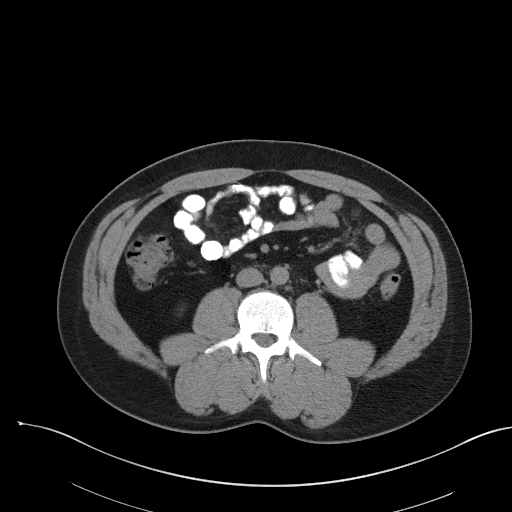
[im 67/105  soft-tissue]
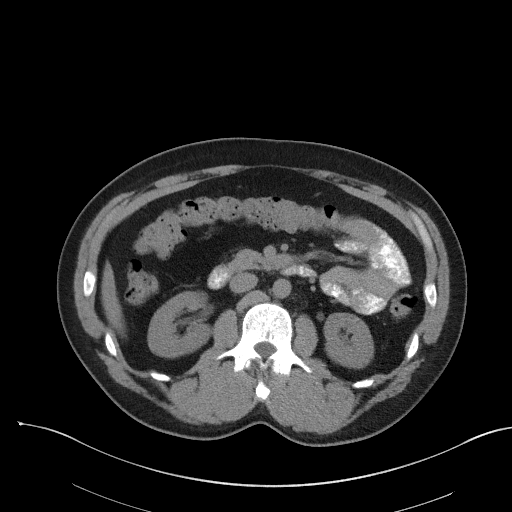
[im 67/105  bone]
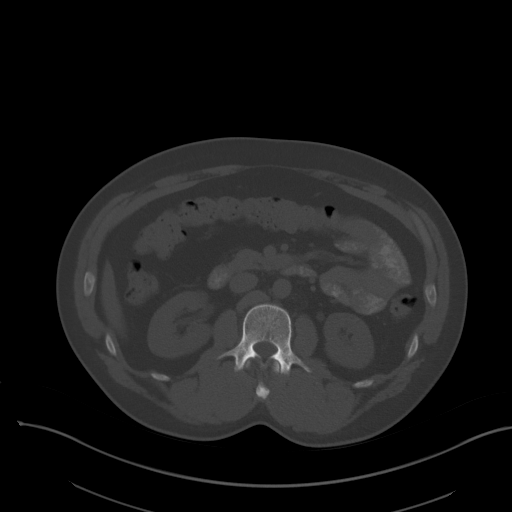
[im 75/105  soft-tissue]
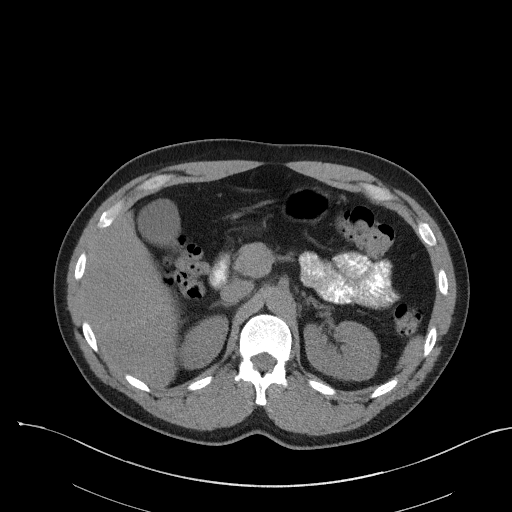
[im 84/105  soft-tissue]
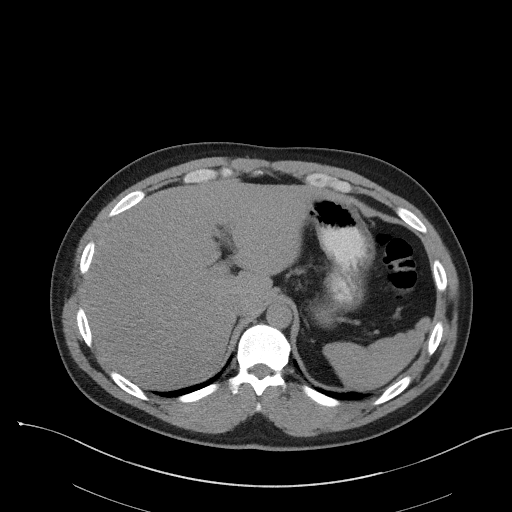
[im 92/105  soft-tissue]
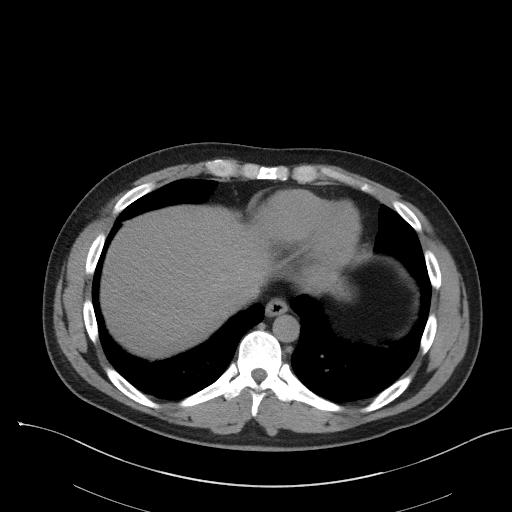
[im 100/105  soft-tissue]
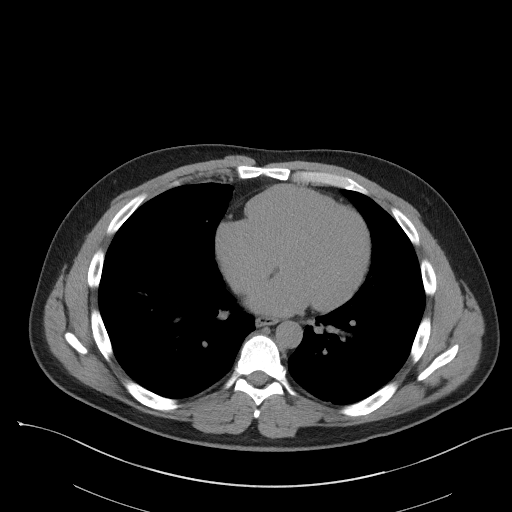

[Series 5: coronal st · coronal · 0.90mm/px · 3 of 101 slices shown]
[im 34/101  soft-tissue]
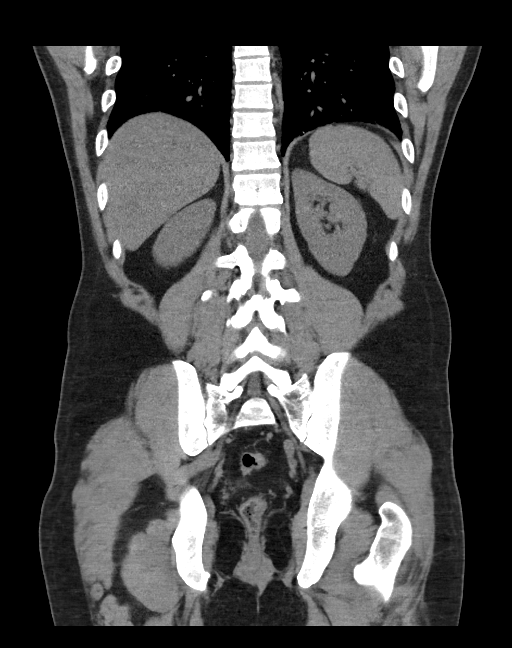
[im 45/101  soft-tissue]
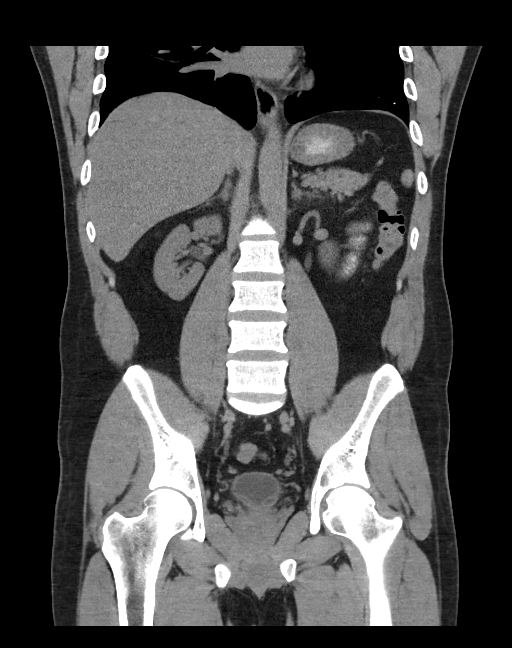
[im 56/101  soft-tissue]
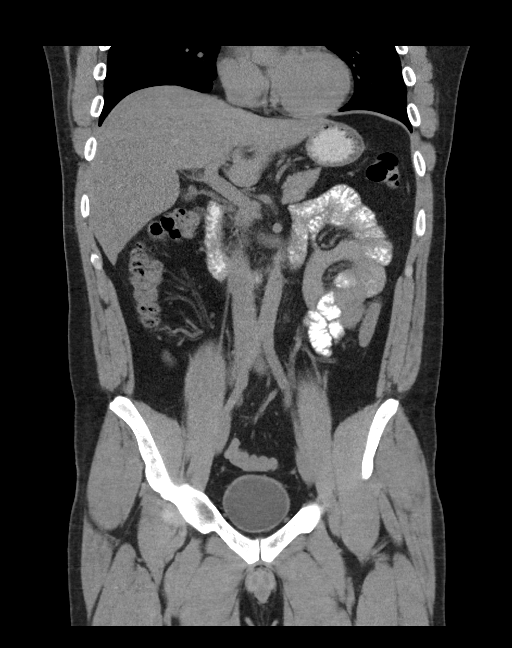

[16 of 46 positions shown; findings below may reference images not displayed]

FINDINGS: Lower chest: Lung bases demonstrate no acute airspace process.
Calcified granuloma over the right middle lobe.

Hepatobiliary: Liver, gallbladder and biliary tree are normal.

Pancreas: Normal.

Spleen: Normal.

Adrenals/Urinary Tract: Adrenal glands are normal. Kidneys are
normal in size without hydronephrosis or nephrolithiasis. Ureters
and bladder are normal.

Stomach/Bowel: Stomach and small bowel are normal. Appendix is
normal. Colon is normal.

Vascular/Lymphatic: Abdominal aorta is normal in caliber. There is
no evidence of adenopathy.

Reproductive: Normal.

Other: No free fluid or focal inflammatory change.

Musculoskeletal: No focal abnormality.
IMPRESSION: No acute findings in the abdomen/pelvis.

## 2023-07-13 ENCOUNTER — Ambulatory Visit
Admission: EM | Admit: 2023-07-13 | Discharge: 2023-07-13 | Disposition: A | Discharge status: Home / Self Care | Payer: 59 | Attending: Family Medicine | Admitting: Family Medicine

## 2023-07-13 ENCOUNTER — Encounter: Payer: Self-pay | Admitting: Emergency Medicine

## 2023-07-13 ENCOUNTER — Ambulatory Visit (INDEPENDENT_AMBULATORY_CARE_PROVIDER_SITE_OTHER): Payer: 59

## 2023-07-13 DIAGNOSIS — K219 Gastro-esophageal reflux disease without esophagitis: Secondary | ICD-10-CM

## 2023-07-13 DIAGNOSIS — K5900 Constipation, unspecified: Secondary | ICD-10-CM | POA: Diagnosis not present

## 2023-07-13 DIAGNOSIS — R1031 Right lower quadrant pain: Secondary | ICD-10-CM

## 2023-07-13 MED ORDER — POLYETHYLENE GLYCOL 3350 17 GM/SCOOP PO POWD: 1.0000 | Freq: Once | ORAL | 0 refills | Status: AC

## 2023-07-13 MED ORDER — PANTOPRAZOLE SODIUM 20 MG PO TBEC: 20.0000 mg | DELAYED_RELEASE_TABLET | Freq: Every day | ORAL | 0 refills | Status: AC

## 2023-07-13 NOTE — ED Triage Notes (Signed)
Patient c/o RLQ pain x 3 days.  Denies any nausea or vomiting.  Patient did have diarrhea prior to the pain but not since.  Denies any OTC meds.  Patient does drink a lot of water which has caused urinary frequency.

## 2023-07-13 NOTE — ED Provider Notes (Signed)
[facility]    CSN: [id number] Arrival date & time: 07/13/23  1006      History   Chief Complaint Chief Complaint  Patient presents with   Abdominal Pain    HPI Marco Blackwell is a 42 y.o. male.   HPI  Pleasant 42 year old Hispanic gentleman.  Seen with Spanish interpreter Viviana Patient has right lower quadrant pain for 3 days.  He states he had some loose bowels at the onset, but has not had a bowel movement for 3 days.  Feels like he needs to go to the bathroom but cannot.  No nausea or vomiting.  No change in appetite.  No fever or chills.  He has longstanding problems with GERD.  He states that the medicine so far have helped him.  He does not have a PCP.  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 No pertinent surgical history.     Home Medications    Prior to Admission medications   Medication Sig Start Date End Date Taking? Authorizing Provider  pantoprazole (PROTONIX) 20 MG tablet Take 1 tablet (20 mg total) by mouth daily. 07/13/23  Yes Eustace Moore, MD  polyethylene glycol powder [facility]) 17 GM/SCOOP powder Take 255 g by mouth once for 1 dose. 07/13/23 07/13/23 Yes Eustace Moore, MD    Family History Family History  Problem Relation Age of Onset   Hypertension Mother    Thyroid disease Mother    Healthy Father     Social History Social History   Tobacco Use   Smoking status: Never   Smokeless tobacco: Never  Vaping Use   Vaping status:  Never Used  Substance Use Topics   Alcohol use: Yes    Alcohol/week: 5.0 standard drinks of alcohol    Types: 3 Cans of beer, 2 Standard drinks or equivalent per week   Drug use: No     Allergies   Patient has no known allergies.   Review of Systems Review of Systems See HPI  Physical Exam Triage Vital Signs ED Triage Vitals  Encounter Vitals Group     BP 07/13/23 1020 134/89     Systolic BP Percentile --      Diastolic BP Percentile --      Pulse Rate 07/13/23 1020 68     Resp 07/13/23 1020 18     Temp 07/13/23 1020 98.3 F (36.8 C)     Temp Source 07/13/23 1020 Oral     SpO2 07/13/23 1020 100 %     Weight 07/13/23 1022 190 lb (86.2 kg)     Height 07/13/23 1022 5\' 7"  (1.702 m)     Head Circumference --      Peak Flow --      Pain Score 07/13/23 1021 7     Pain Loc --      Pain Education --      Exclude from Growth Chart --    No data found.  Updated Vital Signs BP 134/89 (BP Location: Right Arm)   Pulse  68   Temp 98.3 F (36.8 C) (Oral)   Resp 18   Ht 5\' 7"  (1.702 m)   Wt 86.2 kg   SpO2 100%   BMI 29.76 kg/m       Physical Exam Constitutional:      General: He is not in acute distress.    Appearance: He is well-developed.  HENT:     Head: Normocephalic and atraumatic.     Mouth/Throat:     Mouth: Mucous membranes are moist.  Eyes:     Conjunctiva/sclera: Conjunctivae normal.     Pupils: Pupils are equal, round, and reactive to light.  Cardiovascular:     Rate and Rhythm: Normal rate and regular rhythm.     Heart sounds: Normal heart sounds.  Pulmonary:     Effort: Pulmonary effort is normal. No respiratory distress.  Abdominal:     General: Abdomen is flat. Bowel sounds are normal. There is no distension.     Palpations: Abdomen is soft. There is no hepatomegaly or splenomegaly.     Tenderness: There is abdominal tenderness in the right lower quadrant.     Comments: Moderate tenderness to palpation in the right lower quadrant and suprapubic  region.  No guarding or tenderness.  No masses palpable  Musculoskeletal:        General: Normal range of motion.     Cervical back: Normal range of motion.  Skin:    General: Skin is warm and dry.  Neurological:     Mental Status: He is alert.      UC Treatments / Results  Labs (all labs ordered are listed, but only abnormal results are displayed) Labs Reviewed - No data to display  EKG   Radiology DG Abdomen 1 View  Result Date: 07/13/2023 CLINICAL DATA:  Right lower quadrant pain and constipation for 3 days. EXAM: ABDOMEN - 1 VIEW COMPARISON:  None Available. FINDINGS: There is a nonobstructive bowel gas pattern. There is moderate stool burden in the right hemiabdomen. There is no gross organomegaly or abnormal soft tissue calcification. There is no definite free intraperitoneal air There is no acute osseous abnormality. IMPRESSION: Moderate stool burden.  No acute finding. Electronically Signed   By: Lesia Hausen M.D.   On: 07/13/2023 10:53    Procedures Procedures (including critical care time)  Medications Ordered in UC Medications - No data to display  Initial Impression / Assessment and Plan / UC Course  I have reviewed the triage vital signs and the nursing notes.  Pertinent labs & imaging results that were available during my care of the patient were reviewed by me and considered in my medical decision making (see chart for details).     I explained to the patient with interpreter that I was going to offer him 1 treatment for constipation and written information about constipation (in Spanish) 2 medication for his GERD 3 referral to gastroenterology for additional evaluation and testing.  Longstanding GE may be caused by a bacteria (as H. pylori).  This requires testing and treatment that I cannot provide Final Clinical Impressions(s) / UC Diagnoses   Final diagnoses:  Right lower quadrant abdominal pain  Constipation, unspecified constipation type   Gastroesophageal reflux disease, unspecified whether esophagitis present     Discharge Instructions      Drink lots of water Tale 2 scoops of miralax today, then one a day until you feel bette May use any time needed for constipation  Take protonix for the stomach pain See  GI doctor in follow up     ED Prescriptions     Medication Sig Dispense Auth. Provider   polyethylene glycol powder (GLYCOLAX/MIRALAX) 17 GM/SCOOP powder Take 255 g by mouth once for 1 dose. 255 g Eustace Moore, MD   pantoprazole (PROTONIX) 20 MG tablet Take 1 tablet (20 mg total) by mouth daily. 30 tablet Eustace Moore, MD      PDMP not reviewed this encounter.   Eustace Moore, MD 07/13/23 [phone]

## 2023-07-13 NOTE — Discharge Instructions (Addendum)
Drink lots of water Tale 2 scoops of miralax today, then one a day until you feel bette May use any time needed for constipation  Take protonix for the stomach pain See GI doctor in follow up
